# Patient Record
Sex: Male | Born: 1945 | Race: White | Hispanic: No | Marital: Married | Smoking: Never smoker
Health system: Southern US, Community
[De-identification: ages and names within clinical notes are randomized; demographics above are authoritative.]

## PROBLEM LIST (undated history)

## (undated) DIAGNOSIS — I8393 Asymptomatic varicose veins of bilateral lower extremities: Secondary | ICD-10-CM

## (undated) DIAGNOSIS — F341 Dysthymic disorder: Secondary | ICD-10-CM

## (undated) DIAGNOSIS — E785 Hyperlipidemia, unspecified: Secondary | ICD-10-CM

## (undated) DIAGNOSIS — N400 Enlarged prostate without lower urinary tract symptoms: Secondary | ICD-10-CM

## (undated) DIAGNOSIS — R1013 Epigastric pain: Secondary | ICD-10-CM

## (undated) DIAGNOSIS — I10 Essential (primary) hypertension: Secondary | ICD-10-CM

## (undated) DIAGNOSIS — F5104 Psychophysiologic insomnia: Secondary | ICD-10-CM

## (undated) HISTORY — PX: HERNIA REPAIR: SHX51

---

## 2008-09-19 ENCOUNTER — Ambulatory Visit: Payer: Self-pay | Admitting: Internal Medicine

## 2009-03-11 ENCOUNTER — Ambulatory Visit: Payer: Self-pay | Admitting: Internal Medicine

## 2011-10-31 ENCOUNTER — Ambulatory Visit: Payer: Self-pay | Admitting: Internal Medicine

## 2013-08-04 ENCOUNTER — Ambulatory Visit: Payer: Self-pay | Admitting: Family Medicine

## 2014-08-24 ENCOUNTER — Ambulatory Visit: Payer: Self-pay | Admitting: Physician Assistant

## 2016-05-17 ENCOUNTER — Ambulatory Visit (INDEPENDENT_AMBULATORY_CARE_PROVIDER_SITE_OTHER): Payer: Worker's Compensation

## 2016-05-17 ENCOUNTER — Encounter: Payer: Self-pay | Admitting: *Deleted

## 2016-05-17 ENCOUNTER — Ambulatory Visit
Admission: EM | Admit: 2016-05-17 | Discharge: 2016-05-17 | Disposition: A | Discharge status: Home / Self Care | Payer: Worker's Compensation | Attending: Family Medicine | Admitting: Family Medicine

## 2016-05-17 DIAGNOSIS — S86812A Strain of other muscle(s) and tendon(s) at lower leg level, left leg, initial encounter: Secondary | ICD-10-CM | POA: Diagnosis not present

## 2016-05-17 MED ORDER — HYDROCODONE-ACETAMINOPHEN 5-325 MG PO TABS: 1.0000 | ORAL_TABLET | Freq: Four times a day (QID) | ORAL | 0 refills | Status: DC | PRN

## 2016-05-17 NOTE — ED Triage Notes (Signed)
Pt jumped off a truck yesterday and landed on left leg with immediate pain to left calf and ankle. Edema to left calf and difficulty bearing weight on left leg.

## 2016-05-17 NOTE — ED Provider Notes (Signed)
[facility]    CSN: [id number] Arrival date & time: 05/17/16  [id number]  First Provider Contact:  None       History   Chief Complaint Chief Complaint  Patient presents with  . Leg Pain    HPI Danny Murphy is a 70 y.o. male.   The history is provided by the patient.  Pt jumped off a truck yesterday and landed on left leg with immediate pain to left calf and ankle. Edema to left calf and difficulty bearing weight on left leg. Currently complains of pain to the mid lower leg, around the calf area, posterior. Currently denies any ankle, foot or knee pain.    History reviewed. No pertinent past medical history.  There are no active problems to display for this patient.   Past Surgical History:  Procedure Laterality Date  . HERNIA REPAIR         Home Medications    Prior to Admission medications   Medication Sig Start Date End Date Taking? Authorizing Provider  buPROPion (WELLBUTRIN XL) 300 MG 24 hr tablet Take 300 mg by mouth daily.   Yes Historical Provider, MD  HYDROcodone-acetaminophen (NORCO/VICODIN) 5-325 MG tablet Take 1-2 tablets by mouth every 6 (six) hours as needed. 05/17/16   Payton Mccallumrlando Eirene Rather, MD    Family History History reviewed. No pertinent family history.  Social History Social History  Substance Use Topics  . Smoking status: Never Smoker  . Smokeless tobacco: Never Used  . Alcohol use No     Allergies   Review of patient's allergies indicates no known allergies.   Review of Systems Review of Systems   Physical Exam Triage Vital Signs ED Triage Vitals [05/17/16 0901]  Enc Vitals Group     BP (!) 123/99     Pulse Rate 71     Resp 16     Temp 98.1 F (36.7 C)     Temp Source Oral     SpO2 99 %     Weight      Height      Head Circumference      Peak Flow      Pain Score      Pain Loc      Pain Edu?      Excl. in GC?    No data found.   Updated Vital Signs BP (!) 123/99 (BP Location: Left Arm)   Pulse 71   Temp  98.1 F (36.7 C) (Oral)   Resp 16   Ht 6\' 2"  (1.88 m)   Wt 205 lb (93 kg)   SpO2 99%   BMI 26.32 kg/m   Visual Acuity Right Eye Distance:   Left Eye Distance:   Bilateral Distance:    Right Eye Near:   Left Eye Near:    Bilateral Near:     Physical Exam  Constitutional: He appears well-developed and well-nourished. No distress.  Musculoskeletal: Normal range of motion.       Left lower leg: He exhibits tenderness (over the posterior lower leg, calf area), swelling and edema. He exhibits no bony tenderness, no deformity and no laceration.  Left lower extremity/leg neurovascularly intact  Skin: He is not diaphoretic.  Nursing note and vitals reviewed.    UC Treatments / Results  Labs (all labs ordered are listed, but only abnormal results are displayed) Labs Reviewed - No data to display  EKG  EKG Interpretation None       Radiology Dg Tibia/fibula Left  Result Date: 05/17/2016 CLINICAL DATA:  Pain following twisting type injury EXAM: LEFT TIBIA AND FIBULA - 2 VIEW COMPARISON:  Left knee March 11, 2009 FINDINGS: Frontal lateral views were obtained. There is no demonstrable fracture or dislocation. No abnormal periosteal reaction. No knee or ankle joint effusion. There is a spur arising from the inferior calcaneus. IMPRESSION: Inferior calcaneal spur. No fracture or dislocation. No apparent arthropathy. Electronically Signed   By: Bretta BangWilliam  Woodruff III M.D.   On: 05/17/2016 09:37    Procedures Procedures (including critical care time)  Medications Ordered in UC Medications - No data to display   Initial Impression / Assessment and Plan / UC Course  I have reviewed the triage vital signs and the nursing notes.  Pertinent labs & imaging results that were available during my care of the patient were reviewed by me and considered in my medical decision making (see chart for details).  Clinical Course      Final Clinical Impressions(s) / UC Diagnoses   Final  diagnoses:  Strain of calf muscle, left, initial encounter    New Prescriptions New Prescriptions   HYDROCODONE-ACETAMINOPHEN (NORCO/VICODIN) 5-325 MG TABLET    Take 1-2 tablets by mouth every 6 (six) hours as needed.   1. x-ray results and diagnosis reviewed with patient 2. rx as per orders above; reviewed possible side effects, interactions, risks and benefits  3. Recommend supportive treatment with rest, elevation, ice, gentle range of motion 4. Follow-up in 1 week at [facility] (information given to patient)   Payton Mccallumrlando Malaisha Silliman, MD 05/17/16 1000

## 2016-05-23 ENCOUNTER — Ambulatory Visit
Admission: RE | Admit: 2016-05-23 | Discharge: 2016-05-23 | Disposition: A | Discharge status: Home / Self Care | Payer: Worker's Compensation | Source: Ambulatory Visit | Attending: Family | Admitting: Family

## 2016-05-23 ENCOUNTER — Other Ambulatory Visit: Payer: Self-pay | Admitting: Family

## 2016-05-23 DIAGNOSIS — M79662 Pain in left lower leg: Secondary | ICD-10-CM

## 2016-05-23 DIAGNOSIS — M7989 Other specified soft tissue disorders: Principal | ICD-10-CM

## 2016-05-23 DIAGNOSIS — M79605 Pain in left leg: Secondary | ICD-10-CM | POA: Insufficient documentation

## 2017-07-08 ENCOUNTER — Ambulatory Visit: Payer: Medicare Other

## 2017-07-08 ENCOUNTER — Encounter: Payer: Self-pay | Admitting: Emergency Medicine

## 2017-07-08 ENCOUNTER — Ambulatory Visit
Admission: EM | Admit: 2017-07-08 | Discharge: 2017-07-08 | Disposition: A | Discharge status: Home / Self Care | Payer: Medicare Other | Attending: Family Medicine | Admitting: Family Medicine

## 2017-07-08 DIAGNOSIS — R059 Cough, unspecified: Secondary | ICD-10-CM

## 2017-07-08 DIAGNOSIS — R05 Cough: Secondary | ICD-10-CM | POA: Diagnosis not present

## 2017-07-08 DIAGNOSIS — J181 Lobar pneumonia, unspecified organism: Secondary | ICD-10-CM | POA: Diagnosis not present

## 2017-07-08 DIAGNOSIS — Z79899 Other long term (current) drug therapy: Secondary | ICD-10-CM | POA: Insufficient documentation

## 2017-07-08 DIAGNOSIS — J189 Pneumonia, unspecified organism: Secondary | ICD-10-CM | POA: Diagnosis not present

## 2017-07-08 MED ORDER — HYDROCOD POLST-CPM POLST ER 10-8 MG/5ML PO SUER: 5.0000 mL | Freq: Two times a day (BID) | ORAL | 0 refills | Status: AC | PRN

## 2017-07-08 MED ORDER — LEVOFLOXACIN 500 MG PO TABS: 500.0000 mg | ORAL_TABLET | Freq: Every day | ORAL | 0 refills | Status: AC

## 2017-07-08 NOTE — Discharge Instructions (Signed)
Follow up with Primary Care provider in 3 weeks for recheck chest x-ray

## 2017-07-08 NOTE — ED Triage Notes (Signed)
Patient c/o cough and chest congestion for 2 weeks.   Patient reports low grade fever at night.

## 2017-07-08 NOTE — ED Provider Notes (Signed)
[facility]    CSN: [id number] Arrival date & time: 07/08/17  7829     History   Chief Complaint Chief Complaint  Patient presents with  . Cough    HPI Danny Murphy is a 71 y.o. male.   The history is provided by the patient.  Cough  Cough characteristics:  Productive Sputum characteristics:  Yellow Severity:  Moderate Onset quality:  Sudden Duration:  2 weeks Timing:  Constant Progression:  Worsening Chronicity:  New Smoker: no   Context: upper respiratory infection   Context: not animal exposure, not exposure to allergens, not fumes, not occupational exposure, not sick contacts, not smoke exposure, not weather changes and not with activity   Relieved by:  Nothing Worsened by:  Activity Ineffective treatments:  Cough suppressants Associated symptoms: chills, fever and shortness of breath   Associated symptoms: no chest pain, no diaphoresis, no ear fullness, no ear pain, no eye discharge, no headaches, no myalgias, no rash, no rhinorrhea, no sinus congestion, no sore throat, no weight loss and no wheezing     History reviewed. No pertinent past medical history.  There are no active problems to display for this patient.   Past Surgical History:  Procedure Laterality Date  . HERNIA REPAIR         Home Medications    Prior to Admission medications   Medication Sig Start Date End Date Taking? Authorizing Provider  clonazePAM (KLONOPIN) 0.5 MG tablet Take 0.5 mg by mouth daily.   Yes [provider]  traZODone (DESYREL) 50 MG tablet Take 50 mg by mouth at bedtime.   Yes [provider]  buPROPion (WELLBUTRIN XL) 300 MG 24 hr tablet Take 300 mg by mouth daily.    [provider]  chlorpheniramine-HYDROcodone (TUSSIONEX PENNKINETIC ER) 10-8 MG/5ML SUER Take 5 mLs by mouth every 12 (twelve) hours as needed. 07/08/17   Payton Mccallum, MD  levofloxacin (LEVAQUIN) 500 MG tablet Take 1 tablet (500 mg total) by mouth daily.  07/08/17   Payton Mccallum, MD    Family History History reviewed. No pertinent family history.  Social History Social History  Substance Use Topics  . Smoking status: Never Smoker  . Smokeless tobacco: Never Used  . Alcohol use No     Allergies   Patient has no known allergies.   Review of Systems Review of Systems  Constitutional: Positive for chills and fever. Negative for diaphoresis and weight loss.  HENT: Negative for ear pain, rhinorrhea and sore throat.   Eyes: Negative for discharge.  Respiratory: Positive for cough and shortness of breath. Negative for wheezing.   Cardiovascular: Negative for chest pain.  Musculoskeletal: Negative for myalgias.  Skin: Negative for rash.  Neurological: Negative for headaches.     Physical Exam Triage Vital Signs ED Triage Vitals  Enc Vitals Group     BP 07/08/17 0832 (!) 142/72     Pulse Rate 07/08/17 0832 77     Resp 07/08/17 0832 16     Temp 07/08/17 0832 98.6 F (37 C)     Temp Source 07/08/17 0832 Oral     SpO2 07/08/17 0832 96 %     Weight 07/08/17 0829 206 lb 12.8 oz (93.8 kg)     Height 07/08/17 0829 6' 0.5" (1.842 m)     Head Circumference --      Peak Flow --      Pain Score 07/08/17 0829 0     Pain Loc --  Pain Edu? --      Excl. in GC? --    No data found.   Updated Vital Signs BP (!) 142/72 (BP Location: Left Arm)   Pulse 77   Temp 98.6 F (37 C) (Oral)   Resp 16   Ht 6' 0.5" (1.842 m)   Wt 206 lb 12.8 oz (93.8 kg)   SpO2 96%   BMI 27.66 kg/m   Visual Acuity Right Eye Distance:   Left Eye Distance:   Bilateral Distance:    Right Eye Near:   Left Eye Near:    Bilateral Near:     Physical Exam  Constitutional: He appears well-developed and well-nourished. No distress.  HENT:  Head: Normocephalic and atraumatic.  Right Ear: Tympanic membrane, external ear and ear canal normal.  Left Ear: Tympanic membrane, external ear and ear canal normal.  Nose: Nose normal.  Mouth/Throat: Uvula  is midline, oropharynx is clear and moist and mucous membranes are normal. No oropharyngeal exudate or tonsillar abscesses.  Eyes: Pupils are equal, round, and reactive to light. Conjunctivae and EOM are normal. Right eye exhibits no discharge. Left eye exhibits no discharge. No scleral icterus.  Neck: Normal range of motion. Neck supple. No tracheal deviation present. No thyromegaly present.  Cardiovascular: Normal rate, regular rhythm and normal heart sounds.   Pulmonary/Chest: Effort normal. No stridor. No respiratory distress. He has no wheezes. He has rales (right base). He exhibits no tenderness.  Lymphadenopathy:    He has no cervical adenopathy.  Neurological: He is alert.  Skin: Skin is warm and dry. No rash noted. He is not diaphoretic.  Nursing note and vitals reviewed.    UC Treatments / Results  Labs (all labs ordered are listed, but only abnormal results are displayed) Labs Reviewed - No data to display  EKG  EKG Interpretation None       Radiology Dg Chest 2 View  Result Date: 07/08/2017 CLINICAL DATA:  Productive cough with bloody sputum for the past 2-3 weeks. Three days of shortness of breath and fever with some pleuritic anterior chest pain. EXAM: CHEST  2 VIEW COMPARISON:  None impacts FINDINGS: The lungs are hyperinflated with hemidiaphragm flattening. There are coarse lung markings in the right lower lobe. There is no discrete infiltrate. The heart and pulmonary vascularity are normal. The mediastinum is normal in width. There is calcification in the wall of the aortic arch. There is mild S shaped thoracolumbar curvature. IMPRESSION: COPD. Coarse lung markings in the right lower lobe likely reflect atelectasis though early pneumonia is not excluded. Followup PA and lateral chest X-ray is recommended in 3-4 weeks following trial of antibiotic therapy to ensure resolution and exclude underlying malignancy. Thoracic aortic atherosclerosis. Electronically Signed   By:  Jery  [place] M.D.   On: 07/08/2017 09:30    Procedures Procedures (including critical care time)  Medications Ordered in UC Medications - No data to display   Initial Impression / Assessment and Plan / UC Course  I have reviewed the triage vital signs and the nursing notes.  Pertinent labs & imaging results that were available during my care of the patient were reviewed by me and considered in my medical decision making (see chart for details).       Final Clinical Impressions(s) / UC Diagnoses   Final diagnoses:  Cough  Community acquired pneumonia of right lower lobe of lung [facility])    New Prescriptions Discharge Medication List as of 07/08/2017  9:42 AM  START taking these medications   Details  chlorpheniramine-HYDROcodone (TUSSIONEX PENNKINETIC ER) 10-8 MG/5ML SUER Take 5 mLs by mouth every 12 (twelve) hours as needed., Starting Tue 07/08/2017, Normal    levofloxacin (LEVAQUIN) 500 MG tablet Take 1 tablet (500 mg total) by mouth daily., Starting Tue 07/08/2017, Normal       1. x-ray results and diagnosis reviewed with patient 2. rx as per orders above; reviewed possible side effects, interactions, risks and benefits  3. Recommend supportive treatment with rest, fluids, otc analgesics prn 4. Discussed with patient recommendation to follow up with PCP in 3 weeks for repeat chest x-ray  5. Follow-up prn if symptoms worsen or don't improve  Controlled Substance Prescriptions [place] Controlled Substance Registry consulted? Not Applicable   Payton Mccallum, MD 07/08/17 1017

## 2017-07-16 ENCOUNTER — Emergency Department: Payer: Medicare Other

## 2017-07-16 ENCOUNTER — Emergency Department
Admission: EM | Admit: 2017-07-16 | Discharge: 2017-07-16 | Disposition: A | Discharge status: Home / Self Care | Payer: Medicare Other | Attending: Student in an Organized Health Care Education/Training Program | Admitting: Student in an Organized Health Care Education/Training Program

## 2017-07-16 ENCOUNTER — Encounter: Payer: Self-pay | Admitting: Emergency Medicine

## 2017-07-16 DIAGNOSIS — R93429 Abnormal radiologic findings on diagnostic imaging of unspecified kidney: Secondary | ICD-10-CM | POA: Insufficient documentation

## 2017-07-16 DIAGNOSIS — R252 Cramp and spasm: Secondary | ICD-10-CM

## 2017-07-16 DIAGNOSIS — E871 Hypo-osmolality and hyponatremia: Secondary | ICD-10-CM | POA: Diagnosis not present

## 2017-07-16 DIAGNOSIS — N289 Disorder of kidney and ureter, unspecified: Secondary | ICD-10-CM | POA: Diagnosis not present

## 2017-07-16 DIAGNOSIS — I10 Essential (primary) hypertension: Secondary | ICD-10-CM | POA: Diagnosis not present

## 2017-07-16 DIAGNOSIS — M79604 Pain in right leg: Secondary | ICD-10-CM | POA: Diagnosis present

## 2017-07-16 HISTORY — DX: Hyperlipidemia, unspecified: E78.5

## 2017-07-16 HISTORY — DX: Epigastric pain: R10.13

## 2017-07-16 HISTORY — DX: Dysthymic disorder: F34.1

## 2017-07-16 HISTORY — DX: Psychophysiologic insomnia: F51.04

## 2017-07-16 HISTORY — DX: Benign prostatic hyperplasia without lower urinary tract symptoms: N40.0

## 2017-07-16 HISTORY — DX: Asymptomatic varicose veins of bilateral lower extremities: I83.93

## 2017-07-16 HISTORY — DX: Essential (primary) hypertension: I10

## 2017-07-16 LAB — PROTIME-INR
INR: 1
Prothrombin Time: 13.1 seconds (ref 11.4–15.2)

## 2017-07-16 LAB — HEPATIC FUNCTION PANEL
ALK PHOS: 52 U/L (ref 38–126)
ALT: 22 U/L (ref 17–63)
AST: 21 U/L (ref 15–41)
Albumin: 3.7 g/dL (ref 3.5–5.0)
BILIRUBIN INDIRECT: 1.2 mg/dL — AB (ref 0.3–0.9)
BILIRUBIN TOTAL: 1.5 mg/dL — AB (ref 0.3–1.2)
Bilirubin, Direct: 0.3 mg/dL (ref 0.1–0.5)
TOTAL PROTEIN: 6.8 g/dL (ref 6.5–8.1)

## 2017-07-16 LAB — LACTIC ACID, PLASMA: Lactic Acid, Venous: 1.1 mmol/L (ref 0.5–1.9)

## 2017-07-16 LAB — BASIC METABOLIC PANEL
Anion gap: 11 (ref 5–15)
BUN: 20 mg/dL (ref 6–20)
CALCIUM: 9.3 mg/dL (ref 8.9–10.3)
CO2: 23 mmol/L (ref 22–32)
CREATININE: 1.4 mg/dL — AB (ref 0.61–1.24)
Chloride: 98 mmol/L — ABNORMAL LOW (ref 101–111)
GFR calc Af Amer: 56 mL/min — ABNORMAL LOW (ref >60)
GFR, EST NON AFRICAN AMERICAN: 49 mL/min — AB (ref >60)
Glucose, Bld: 101 mg/dL — ABNORMAL HIGH (ref 65–99)
POTASSIUM: 4.2 mmol/L (ref 3.5–5.1)
SODIUM: 132 mmol/L — AB (ref 135–145)

## 2017-07-16 LAB — CBC
HEMATOCRIT: 44.1 % (ref 40.0–52.0)
HEMOGLOBIN: 15.2 g/dL (ref 13.0–18.0)
MCH: 31.2 pg (ref 26.0–34.0)
MCHC: 34.5 g/dL (ref 32.0–36.0)
MCV: 90.4 fL (ref 80.0–100.0)
PLATELETS: 330 10*3/uL (ref 150–440)
RBC: 4.88 MIL/uL (ref 4.40–5.90)
RDW: 13.3 % (ref 11.5–14.5)
WBC: 8.4 10*3/uL (ref 3.8–10.6)

## 2017-07-16 LAB — MAGNESIUM: Magnesium: 2 mg/dL (ref 1.7–2.4)

## 2017-07-16 LAB — CK: Total CK: 140 U/L (ref 49–397)

## 2017-07-16 LAB — LIPASE, BLOOD: Lipase: 29 U/L (ref 11–51)

## 2017-07-16 MED ORDER — OXYCODONE-ACETAMINOPHEN 5-325 MG PO TABS
1.0000 | ORAL_TABLET | Freq: Once | ORAL | Status: AC
  Administered 2017-07-16: 1 via ORAL
  Filled 2017-07-16: qty 1

## 2017-07-16 MED ORDER — POLYETHYLENE GLYCOL 3350 17 G PO PACK: 17.0000 g | PACK | Freq: Every day | ORAL | 0 refills | Status: AC

## 2017-07-16 MED ORDER — MORPHINE SULFATE (PF) 4 MG/ML IV SOLN
4.0000 mg | INTRAVENOUS | Status: DC | PRN
  Administered 2017-07-16: 4 mg via INTRAVENOUS
  Filled 2017-07-16: qty 1

## 2017-07-16 MED ORDER — CYCLOBENZAPRINE HCL 10 MG PO TABS
5.0000 mg | ORAL_TABLET | Freq: Once | ORAL | Status: AC
  Administered 2017-07-16: 5 mg via ORAL
  Filled 2017-07-16: qty 1

## 2017-07-16 MED ORDER — IOPAMIDOL (ISOVUE-370) INJECTION 76%
125.0000 mL | Freq: Once | INTRAVENOUS | Status: AC | PRN
  Administered 2017-07-16: 125 mL via INTRAVENOUS

## 2017-07-16 MED ORDER — CYCLOBENZAPRINE HCL 10 MG PO TABS: 10.0000 mg | ORAL_TABLET | Freq: Three times a day (TID) | ORAL | 0 refills | Status: AC | PRN

## 2017-07-16 MED ORDER — SODIUM CHLORIDE 0.9 % IV BOLUS (SEPSIS)
1000.0000 mL | Freq: Once | INTRAVENOUS | Status: AC
  Administered 2017-07-16: 1000 mL via INTRAVENOUS

## 2017-07-16 MED ORDER — LIDOCAINE 5 % EX PTCH: 1.0000 | MEDICATED_PATCH | Freq: Two times a day (BID) | CUTANEOUS | 0 refills | Status: AC

## 2017-07-16 MED ORDER — DIAZEPAM 5 MG PO TABS
5.0000 mg | ORAL_TABLET | Freq: Once | ORAL | Status: AC
  Administered 2017-07-16: 5 mg via ORAL
  Filled 2017-07-16: qty 1

## 2017-07-16 MED ORDER — LIDOCAINE 5 % EX PTCH
1.0000 | MEDICATED_PATCH | CUTANEOUS | Status: DC
  Administered 2017-07-16: 1 via TRANSDERMAL
  Filled 2017-07-16: qty 1

## 2017-07-16 MED ORDER — HYDROCODONE-ACETAMINOPHEN 5-325 MG PO TABS: 1.0000 | ORAL_TABLET | ORAL | 0 refills | Status: AC | PRN

## 2017-07-16 NOTE — ED Provider Notes (Addendum)
[facility] Emergency Department Provider Note  ____________________________________________  Time seen: Approximately 2:38 PM  I have reviewed the triage vital signs and the nursing notes.   HISTORY  Chief Complaint Leg Pain    HPI Danny Murphy is a 71 y.o. male With a history of hypertension, hyperlipidemia, varicosity of the legs, presenting with severe cramping in the right lower extremity. The patient states that his cramps started around 10:30 this morning when he was walking on concrete. He notes them mostly in his calf, and occasionally in the thigh. They are worse with moving and improve if he is still. His wife tried putting a patch of some kind of pain medicine on the posterior calf, without any improvement. No chest pain, shortness of breath, lightheadedness or syncope, no fevers or chills. No trauma.   Past Medical History:  Diagnosis Date  . Chronic insomnia   . Dyspepsia   . Dysthymic disorder   . Hyperlipidemia   . Hypertension   . Hypertrophy of prostate   . Varicose veins of legs     There are no active problems to display for this patient.   Past Surgical History:  Procedure Laterality Date  . HERNIA REPAIR      Current Outpatient Rx  . Order #: [id number] Class: Historical Med  . Order #: [id number] Class: Normal  . Order #: [id number] Class: Historical Med  . Order #: [id number] Class: Historical Med  . Order #: [id number] Class: Historical Med  . Order #: [id number] Class: Historical Med  . Order #: [id number] Class: Historical Med  . Order #: [id number] Class: Normal    Allergies Patient has no known allergies.  History reviewed. No pertinent family history.  Social History Social History  Substance Use Topics  . Smoking status: Never Smoker  . Smokeless tobacco: Never Used  . Alcohol use No    Review of Systems Constitutional: No fever/chills. No lightheadedness or syncope. Eyes: No visual changes. ENT: No sore throat.  No congestion or rhinorrhea. Cardiovascular: Denies chest pain. Denies palpitations. Respiratory: Denies shortness of breath.  No cough. Gastrointestinal: No abdominal pain.  No nausea, no vomiting.  No diarrhea.  No constipation. Genitourinary: Negative for dysuria. Musculoskeletal: Negative for back pain. Positive right lower extremity cramping and pain. Skin: Negative for rash. Neurological: Negative for headaches. No focal numbness, tingling or weakness.     ____________________________________________   PHYSICAL EXAM:  VITAL SIGNS: ED Triage Vitals  Enc Vitals Group     BP 07/16/17 1431 (!) 163/105     Pulse Rate 07/16/17 1431 86     Resp 07/16/17 1431 (!) 26     Temp 07/16/17 1431 98.2 F (36.8 C)     Temp Source 07/16/17 1431 Oral     SpO2 07/16/17 1431 100 %     Weight 07/16/17 1433 206 lb (93.4 kg)     Height 07/16/17 1433 6' (1.829 m)     Head Circumference --      Peak Flow --      Pain Score 07/16/17 1431 10     Pain Loc --      Pain Edu? --      Excl. in GC? --     Constitutional: Alert and oriented. Well appearing and in no acute distress. Intermittent episodes of acute pain. Answers questions appropriately. Eyes: Conjunctivae are normal.  EOMI. No scleral icterus. Head: Atraumatic. Nose: No congestion/rhinnorhea. Mouth/Throat: Mucous membranes are moist.  Neck: No stridor.  Supple.  No JVD. Cardiovascular: Normal rate, regular rhythm.  No murmurs, rubs or gallops.  Respiratory: Normal respiratory effort.  No accessory muscle use or retractions. Lungs CTAB.  No wheezes, rales or ronchi. Gastrointestinal: Soft, nontender and nondistended.  No guarding or rebound.  No peritoneal signs. Musculoskeletal: No LE edema. positivettp in the right calfwithout obvious contraction of the calf muscle, without palpable cords.  Negative Homan's sign.  Normal DP and PT pulse on R.  R Foot is warm.  Cap refill < 2 sec. Neurologic:  A&Ox3.  Speech is clear.  Face and smile  are symmetric.  EOMI.  Moves all extremities well. Skin:  Skin is warm, dry and intact. No rash noted. Psychiatric: Mood and affect are normal. Speech and behavior are normal.  Normal judgement  ____________________________________________   LABS (all labs ordered are listed, but only abnormal results are displayed)  Labs Reviewed  BASIC METABOLIC PANEL - Abnormal; Notable for the following:       Result Value   Sodium 132 (*)    Chloride 98 (*)    Glucose, Bld 101 (*)    Creatinine, Ser 1.40 (*)    GFR calc non Af Amer 49 (*)    GFR calc Af Amer 56 (*)    All other components within normal limits  CBC  MAGNESIUM   ____________________________________________  EKG  ED ECG REPORT I, Rockne Menghini, the attending physician, personally viewed and interpreted this ECG.   Date: 07/16/2017  EKG Time: 1458  Rate: 90  Rhythm: normal sinus rhythm  Axis: leftward  Intervals:none  ST&T Change: No STEMI  ____________________________________________  RADIOLOGY  No results found.  ____________________________________________   PROCEDURES  Procedure(s) performed: None  Procedures  Critical Care performed: No ____________________________________________   INITIAL IMPRESSION / ASSESSMENT AND PLAN / ED COURSE  Pertinent labs & imaging results that were available during my care of the patient were reviewed by me and considered in my medical decision making (see chart for details).  71 y.o. Male who is not on any potassium supplementation or diuretic presenting with acute  Of severe right lower extremity cramping when walking earlier today. Overall, the patient does have some discomfort and we will check his electrolytes. We will also give him an antispasmodic and fluids. I'll get an ultrasound to rule out DVT.   The patient will be signed out to the oncoming physician for final evaluation and disposition.   ____________________________________________  FINAL  CLINICAL IMPRESSION(S) / ED DIAGNOSES  Final diagnoses:  Leg cramping  Hyponatremia  Renal insufficiency         NEW MEDICATIONS STARTED DURING THIS VISIT:  New Prescriptions   No medications on file      Rockne Menghini, MD 07/16/17 1549    Rockne Menghini, MD 07/16/17 [phone]

## 2017-07-16 NOTE — ED Notes (Signed)
Pt given a sandwich tray, crackers and a ginger ale to tolerate. Pt reports he has been having difficulty swallowing for a few weeks. Pt has large bruising to the left flank, chest and groin area with no explanation. Will speka to MD.

## 2017-07-16 NOTE — ED Triage Notes (Signed)
Pt arrived via EMS from home with reports of right leg cramps. Pt states he is not able to walk because of the pain. Pt states the pain started when he was walking around at work at [employer].  Pt states the pain is intermittent and cramps up his whole leg.

## 2017-07-16 NOTE — ED Provider Notes (Signed)
Patient received in sign-out from Dr. Sharma CovertNorman.  Workup and evaluation pending labs and reassessment.  reassessment patient is still having significant pain. Seems to be having spasms to the right lower extremity. On reexamination the patient has extensive bruising on the left flank in the mid epigastric area with no report of any trauma or heavy lifting and is not on any blood thinners. Will order CT angiogram as I am concern for vascular pathology with possible dissection versus embolic phenomenon to the lower extremity.  Clinical Course as of Jul 16 1950  Wed Jul 16, 2017  1635 LE duplex w/ no evidence of DVT.  [PR]  1854 CT imaging with no acute abnormality importantly no peripheral vascular disease or aneurysm. Patient states that symptoms have resolved or improved at least. Do suspect some component of dehydration and therefore we'll continue with IV fluids. Did discuss renal cyst versus tumor noted on CT imaging that will need close follow-up.  [PR]    Clinical Course User Index [PR] Willy Eddyobinson, Mannie Ohlin, MD   patient reassessed and pain is resolved. Patientwithecchymosisbuthiscoagulationpanelandplateletsarenormalhehasnoevidenceofotherspontaneousbleedingthereforedosuspectsomecomponentofmildtrauma.Atthispointrelatedheisstableforfollow-upwithPCP.    Willy Eddyobinson, Jaleya Pebley, MD 07/16/17 2027

## 2017-07-16 NOTE — Discharge Instructions (Signed)
follow-up with your PCP for outpatient MRI of your abdomen and kidney for evaluation of the abnormal cyst versus mass on her kidney.

## 2017-07-16 NOTE — ED Notes (Signed)
Pt transported to CT ?

## 2018-11-25 IMAGING — CT CT ANGIO AOBIFEM WO/W CM
2 of 11 series · 11 of 46 positions shown, 15 images · IV contrast (isovue)
Comparison: None.

CLINICAL DATA: Hypertension and hyperlipidemia with lower extremity
pain on the right

EXAM:
CT ANGIOGRAPHY OF ABDOMINAL AORTA WITH ILIOFEMORAL RUNOFF
TECHNIQUE: Multidetector CT imaging of the abdomen, pelvis and lower
extremities was performed using the standard protocol during bolus
administration of intravenous contrast. Multiplanar CT image
reconstructions and MIPs were obtained to evaluate the vascular
anatomy.
CONTRAST:  125 mL Isovue 370.

[Series 4: axial arterial upper · axial · arterial · 0.88mm/px · z∈[-619,+206]mm · 10 of 315 slices shown, 13 images]
[im 20/315  soft-tissue]
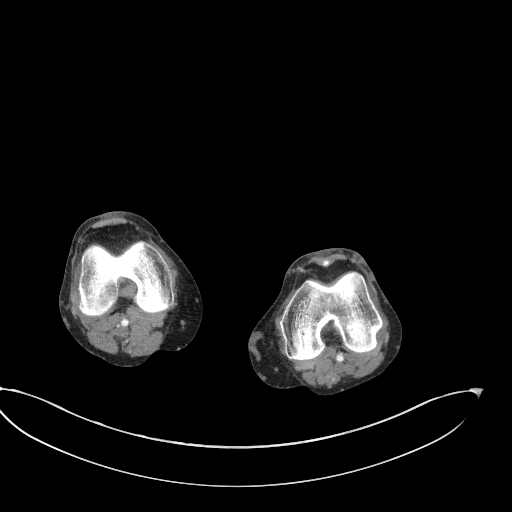
[im 20/315  bone]
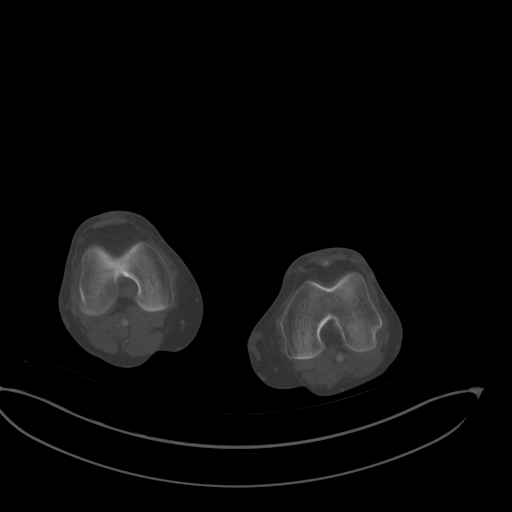
[im 59/315  soft-tissue]
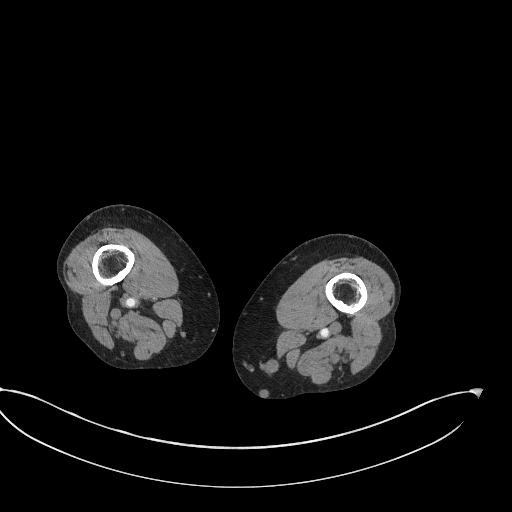
[im 99/315  soft-tissue]
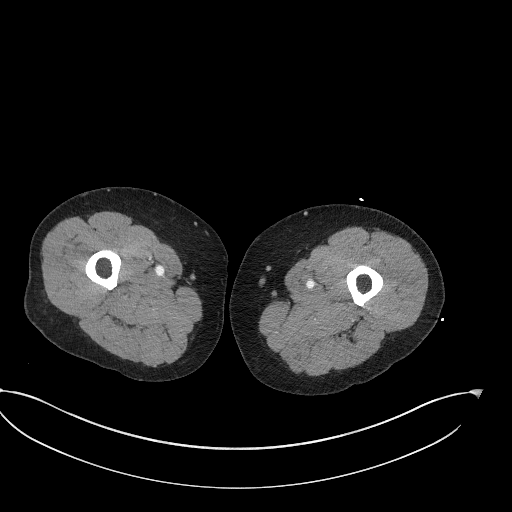
[im 138/315  soft-tissue]
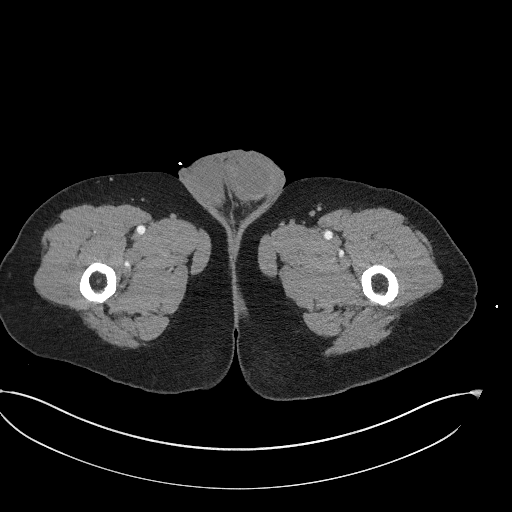
[im 177/315  soft-tissue]
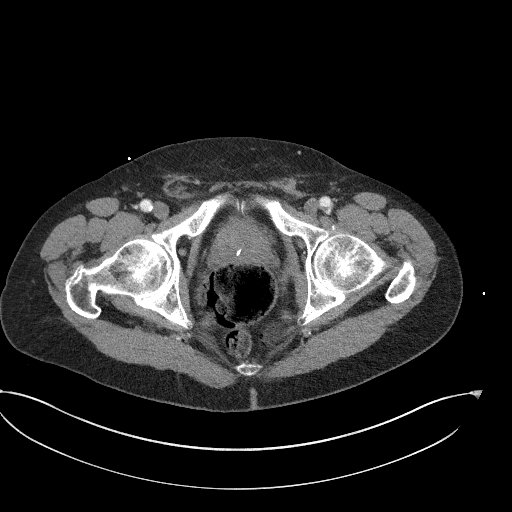
[im 216/315  soft-tissue]
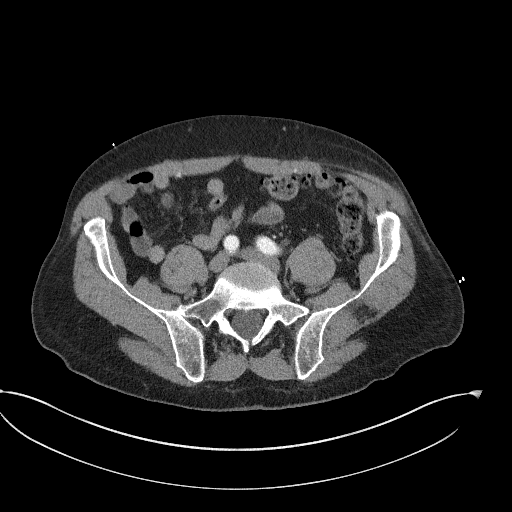
[im 236/315  lung]
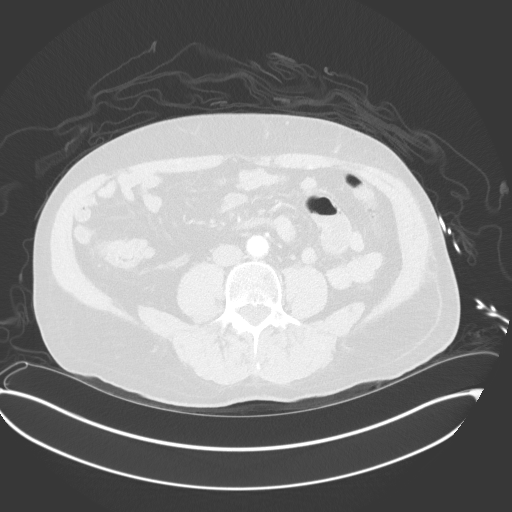
[im 256/315  soft-tissue]
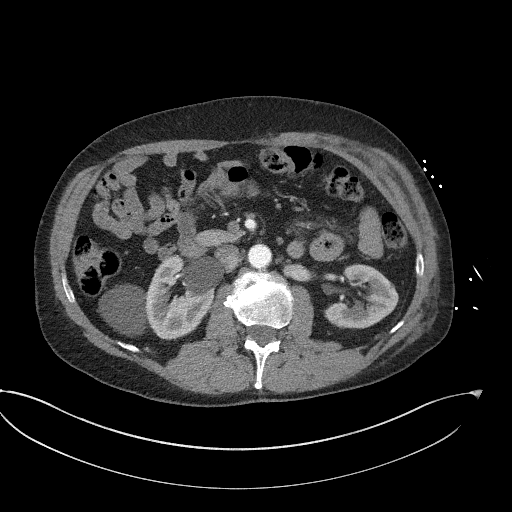
[im 256/315  lung]
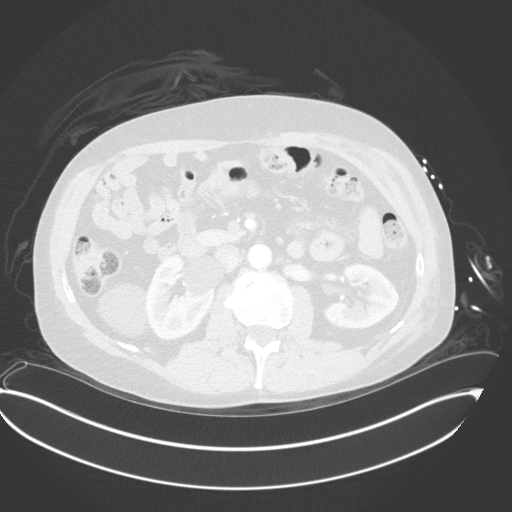
[im 275/315  lung]
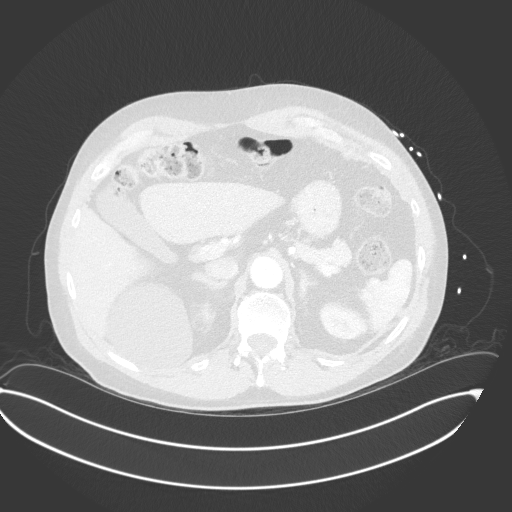
[im 295/315  soft-tissue]
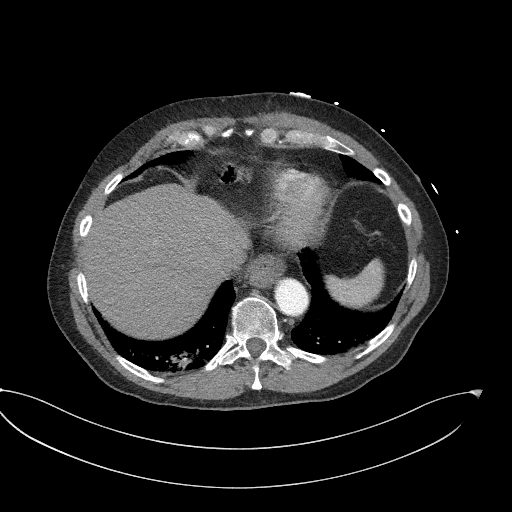
[im 295/315  lung]
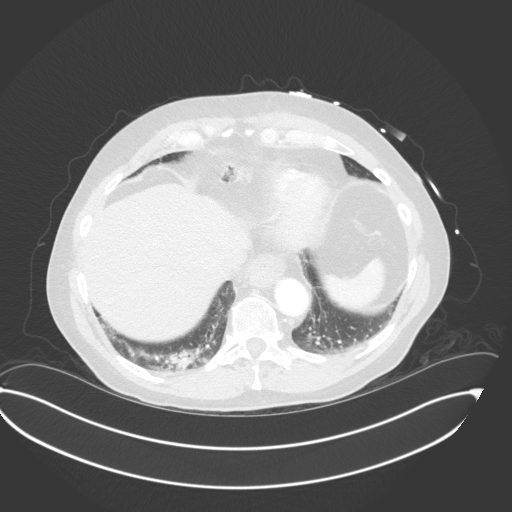

[Series 12: coronal lower · coronal · 0.74mm/px · 1 of 158 slices shown, 2 images]
[im 79/158  soft-tissue]
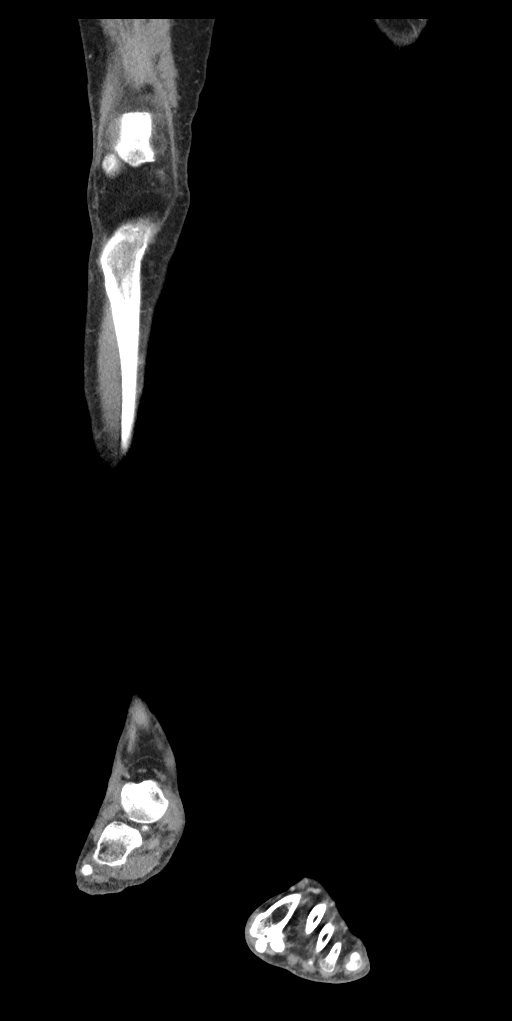
[im 79/158  bone]
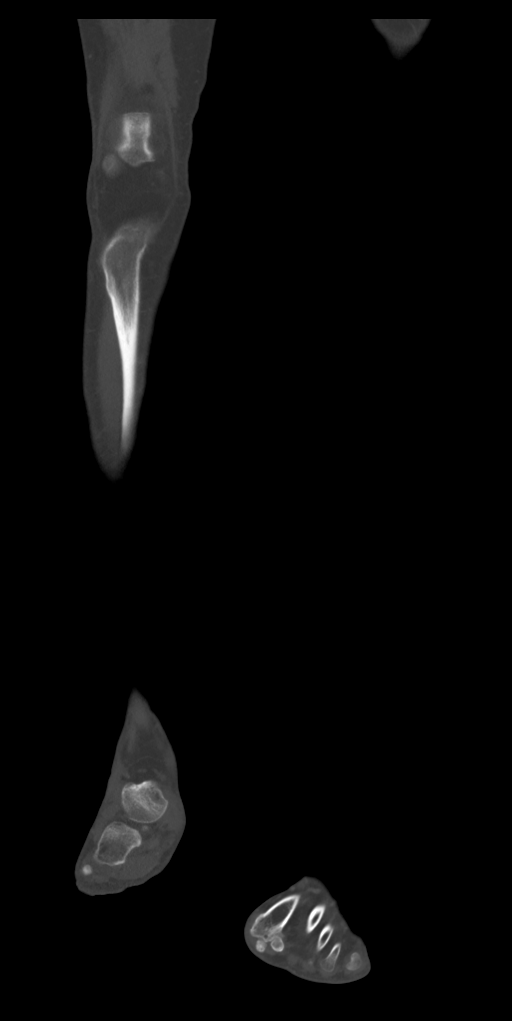

[11 of 46 positions shown; findings below may reference images not displayed]

FINDINGS: VASCULAR

Aorta: The abdominal aorta is widely patent without evidence of
aneurysmal dilatation or significant atherosclerotic disease. No
dissection is identified.

Celiac: Patent without evidence of aneurysm, dissection, vasculitis
or significant stenosis.

SMA: Patent without evidence of aneurysm, dissection, vasculitis or
significant stenosis.

Renals: Single renal arteries are identified bilaterally. They are
widely patent.

IMA: Patent without evidence of aneurysm, dissection, vasculitis or
significant stenosis.

Veins: The IVC is widely patent. No focal abnormality is noted. Note
is made of a retroaortic left renal vein. Varicosities are noted in
the distal left thigh as well as the left calf. No significant
right-sided varicosities are noted.

RIGHT Lower Extremity

Inflow: Very mild atherosclerotic changes are identified within the
common iliac artery. No aneurysmal dilatation or dissection is
noted.

Runoff: Common femoral artery demonstrates mild atherosclerotic
disease within the widely patent femoral bifurcation. The
superficial femoral artery is widely patent as is the popliteal
artery. No significant atherosclerotic changes are noted. The
popliteal trifurcation is widely patent with three-vessel runoff to
the level of the ankle. The posterior tibial artery appears to be
dominant.

LEFT Lower Extremity

Inflow: Very mild atherosclerotic changes are noted in the left
common iliac artery. No significant focal stenosis is noted.

Runoff: Common femoral artery and femoral bifurcation are widely
patent. The superficial femoral and popliteal arteries are widely
patent as well. The popliteal trifurcation on the left is within
normal limits with three-vessel runoff to the level of the left
ankle. Posterior tibial artery appears dominant.

Review of the MIP images confirms the above findings.

NON-VASCULAR

Lower chest: Very patchy infiltrative changes are noted in the right
lower lobe. This may represent early infiltrate or atelectasis.

Hepatobiliary: Liver demonstrates a few scattered hypodensities
likely representing cysts but incompletely characterized on this
exam. The gallbladder is within normal limits.

Pancreas: Unremarkable. No pancreatic ductal dilatation or
surrounding inflammatory changes.

Spleen: Normal in size without focal abnormality.

Adrenals/Urinary Tract: The adrenal glands are within normal limits.
The right kidney shows renal cystic change without renal calculi. No
obstructive changes are seen. The left kidney demonstrates a
peripherally enhancing 2.1 cm mass lesion best identified on image
number 52 of series 4. This is suspicious for a renal cell neoplasm
till proven otherwise. Nonemergent workup is recommended. The
bladder is partially distended.

Stomach/Bowel: Scattered diverticular changes noted without evidence
of diverticulitis. No obstructive or inflammatory changes are seen.
The appendix is well visualized and within normal limits. Small
sliding-type hiatal hernia is noted.

Lymphatic: No significant lymphadenopathy is identified.

Reproductive: Prostatic calcifications are seen. No other focal
prostatic abnormality is noted.

Other: No abdominal wall hernia or abnormality. No abdominopelvic
ascites.

Musculoskeletal: Degenerative changes of the lumbar spine are noted.
IMPRESSION: VASCULAR

No evidence of aortic aneurysm or dissection. No peripheral embolus
or thrombosis is noted.

Normal three-vessel runoff is noted bilaterally.

Venous varicosities are noted in the left lower extremity.

NON-VASCULAR

2.1 cm peripherally enhancing mass lesion in the left kidney
suspicious for renal neoplasm till proven otherwise. Nonemergent
contrast-enhanced MRI is recommended to allow for optimum imaging.

Diverticulosis without diverticulitis.

Patchy opacities in the right lung base likely representing
atelectasis or early infiltrate.

These results were called by telephone at the time of interpretation
on 07/16/2017 at [DATE] to Dr. KL HARTFORD , who verbally
acknowledged these results.
